# Patient Record
Sex: Male | Born: 1990 | Race: White | Hispanic: No | Marital: Single | Smoking: Never smoker
Health system: Southern US, Community
[De-identification: ages and names within clinical notes are randomized; demographics above are authoritative.]

## PROBLEM LIST (undated history)

## (undated) HISTORY — PX: TONSILLECTOMY: SUR1361

## (undated) HISTORY — PX: WISDOM TOOTH EXTRACTION: SHX21

---

## 2019-12-20 ENCOUNTER — Encounter (HOSPITAL_BASED_OUTPATIENT_CLINIC_OR_DEPARTMENT_OTHER): Payer: Self-pay

## 2019-12-20 ENCOUNTER — Emergency Department (HOSPITAL_BASED_OUTPATIENT_CLINIC_OR_DEPARTMENT_OTHER): Payer: Self-pay

## 2019-12-20 ENCOUNTER — Emergency Department (HOSPITAL_BASED_OUTPATIENT_CLINIC_OR_DEPARTMENT_OTHER): Payer: No Typology Code available for payment source

## 2019-12-20 ENCOUNTER — Emergency Department (HOSPITAL_BASED_OUTPATIENT_CLINIC_OR_DEPARTMENT_OTHER)
Admission: EM | Admit: 2019-12-20 | Discharge: 2019-12-20 | Disposition: A | Discharge status: Home / Self Care | Payer: No Typology Code available for payment source | Attending: Emergency Medicine | Admitting: Emergency Medicine

## 2019-12-20 ENCOUNTER — Other Ambulatory Visit: Payer: Self-pay

## 2019-12-20 DIAGNOSIS — Y990 Civilian activity done for income or pay: Secondary | ICD-10-CM | POA: Diagnosis not present

## 2019-12-20 DIAGNOSIS — W208XXA Other cause of strike by thrown, projected or falling object, initial encounter: Secondary | ICD-10-CM | POA: Insufficient documentation

## 2019-12-20 DIAGNOSIS — S3994XA Unspecified injury of external genitals, initial encounter: Secondary | ICD-10-CM | POA: Insufficient documentation

## 2019-12-20 MED ORDER — LIDOCAINE HCL URETHRAL/MUCOSAL 2 % EX GEL
1.0000 "application " | Freq: Once | CUTANEOUS | Status: AC
  Administered 2019-12-20: 1 via TOPICAL
  Filled 2019-12-20: qty 11

## 2019-12-20 MED ORDER — IBUPROFEN 800 MG PO TABS
800.0000 mg | ORAL_TABLET | Freq: Once | ORAL | Status: AC
  Administered 2019-12-20: 800 mg via ORAL
  Filled 2019-12-20: qty 1

## 2019-12-20 NOTE — Discharge Instructions (Signed)
Your work-up today was reassuring, continue to take ibuprofen for the pain. I want you to follow-up with your employee health. If you have any new worsening concerning symptoms please come back to the emergency department.

## 2019-12-20 NOTE — ED Triage Notes (Addendum)
Pt states "something flew out of a machine at work and hit me in the right testicle" ~1225pm-was seen by work nurse-sent to "HPO"/occupational health site and sent to ED-NAD-steady gait

## 2019-12-20 NOTE — ED Provider Notes (Signed)
, Use [facility] EMERGENCY DEPARTMENT Provider Note   CSN: [id number] Arrival date & time: 12/20/19  1435     History Chief Complaint  Patient presents with  . Testicle Injury    Jose Robbins is a 29 y.o. male with no pertinent past medical history that presents the emergency department today for testicle injury.  Patient states that he was at work doing a 20 ton press and something flew out of the machine and hit him in the right testicle.  Patient states that it did not pierce his pants or his boxer briefs.  Patient states that he is having throbbing pain to his right testicle, is not swollen.  Was evaluated by occupational health who told him to come to the emergency department.  Patient has no prior history of injury to that area.  Denies pain elsewhere.  States that his last tetanus shot was last month.  States that the pain is a 7/10 and is constant.  Also slightly radiates into his right leg.  No injury to the right leg.  Does admit to some numbness into his right leg, no numbness or tingling elsewhere.  Denies any dysuria or hematuria or penile pain. States that he was in normal health before this.  Denies any fevers chills nausea, vomiting, abdominal pain, back pain.  HPI     History reviewed. No pertinent past medical history.  There are no problems to display for this patient.   Past Surgical History:  Procedure Laterality Date  . TONSILLECTOMY    . WISDOM TOOTH EXTRACTION         No family history on file.  Social History   Tobacco Use  . Smoking status: Never Smoker  . Smokeless tobacco: Current User  Vaping Use  . Vaping Use: Never used  Substance Use Topics  . Alcohol use: Never  . Drug use: Never    Home Medications Prior to Admission medications   Not on File    Allergies    Codeine  Review of Systems   Review of Systems  Constitutional: Negative for chills, diaphoresis, fatigue and fever.  HENT: Negative for congestion, sore  throat and trouble swallowing.   Eyes: Negative for pain and visual disturbance.  Respiratory: Negative for cough, shortness of breath and wheezing.   Cardiovascular: Negative for chest pain, palpitations and leg swelling.  Gastrointestinal: Negative for abdominal distention, abdominal pain, diarrhea, nausea and vomiting.  Genitourinary: Positive for testicular pain. Negative for decreased urine volume, difficulty urinating, discharge, flank pain, frequency, genital sores, hematuria, penile pain, penile swelling, scrotal swelling and urgency.  Musculoskeletal: Negative for back pain, neck pain and neck stiffness.  Skin: Negative for pallor.  Neurological: Negative for dizziness, speech difficulty, weakness and headaches.  Psychiatric/Behavioral: Negative for confusion.    Physical Exam Updated Vital Signs BP 118/74 (BP Location: Left Arm)   Pulse 85   Temp 98.6 F (37 C) (Oral)   Resp 16   Ht 6\' 3"  (1.905 m)   Wt 92.1 kg   SpO2 97%   BMI 25.37 kg/m   Physical Exam Exam conducted with a chaperone present.  Constitutional:      General: He is not in acute distress.    Appearance: Normal appearance. He is not ill-appearing, toxic-appearing or diaphoretic.  HENT:     Mouth/Throat:     Mouth: Mucous membranes are moist.     Pharynx: Oropharynx is clear.  Eyes:     General: No scleral icterus.  Extraocular Movements: Extraocular movements intact.     Pupils: Pupils are equal, round, and reactive to light.  Cardiovascular:     Rate and Rhythm: Normal rate and regular rhythm.     Pulses: Normal pulses.     Heart sounds: Normal heart sounds.  Pulmonary:     Effort: Pulmonary effort is normal. No respiratory distress.     Breath sounds: Normal breath sounds. No stridor. No wheezing, rhonchi or rales.  Chest:     Chest wall: No tenderness.  Abdominal:     General: Abdomen is flat. There is no distension.     Palpations: Abdomen is soft.     Tenderness: There is no abdominal  tenderness. There is no guarding or rebound.  Genitourinary:    Penis: Normal.      Testes:        Right: Tenderness present.     Epididymis:     Right: Normal.       Comments: Chaperone present very small, half a centimeter in width abrasion to right testicle, with some erythema, does appear burn-like. No bulla, drainage, or bleeding. No edema or erythema surrounding the area. No scrotal swelling or tenderness. Penis normal. Normal cremaster reflex. Musculoskeletal:        General: No swelling or tenderness. Normal range of motion.     Cervical back: Normal range of motion and neck supple. No rigidity.     Right lower leg: No edema.     Left lower leg: No edema.  Skin:    General: Skin is warm and dry.     Capillary Refill: Capillary refill takes less than 2 seconds.     Coloration: Skin is not pale.  Neurological:     General: No focal deficit present.     Mental Status: He is alert and oriented to person, place, and time.  Psychiatric:        Mood and Affect: Mood normal.        Behavior: Behavior normal.     ED Results / Procedures / Treatments   Labs (all labs ordered are listed, but only abnormal results are displayed) Labs Reviewed - No data to display  EKG None  Radiology US Scrotum  Result Date: 12/20/2019 CLINICAL DATA:  Hit in right testicle with object EXAM: SCROTAL ULTRASOUND DOPPLER ULTRASOUND OF THE TESTICLES TECHNIQUE: Complete ultrasound examination of the testicles, epididymis, and other scrotal structures was performed. Color and spectral Doppler ultrasound were also utilized to evaluate blood flow to the testicles. COMPARISON:  None. FINDINGS: Right testicle Measurements: 4.1 x 2.7 x 3 cm. No mass or microlithiasis visualized. Left testicle Measurements: 4.4 x 2.5 x 3.5 cm. No mass or microlithiasis visualized. Right epididymis:  Normal in size and appearance. Left epididymis:  Normal in size and appearance. Hydrocele:  None visualized. Varicocele:  None  visualized. Pulsed Doppler interrogation of both testes demonstrates normal low resistance arterial and venous waveforms bilaterally. IMPRESSION: Negative scrotal ultrasound Electronically Signed   By: Jasmine Pang M.D.   On: 12/20/2019 16:40    Procedures Procedures (including critical care time)  Medications Ordered in ED Medications  ibuprofen (ADVIL) tablet 800 mg (800 mg Oral Given 12/20/19 1535)  lidocaine (XYLOCAINE) 2 % jelly 1 application (1 application Topical Given 12/20/19 1536)    ED Course  I have reviewed the triage vital signs and the nursing notes.  Pertinent labs & imaging results that were available during my care of the patient were reviewed by me and considered  in my medical decision making (see chart for details).    MDM Rules/Calculators/A&P                          Jose Robbins is a 29 y.o. male with no pertinent past medical history that presents the emergency department today for testicle injury. Patient with small abrasion to right testicle, does appear as if patient was burned. Most likley friction burn. No pierce injury. Lidoderm jelly placed regards to pain, will also obtain ultrasound due to blunt trauma of testicle. Patient appears very well, no concerns for urethral injury, or other injury.  States that he did urinate, did not notice any hematuria or painful urination.  Ultrasound negative, pain has been controlled. Patient be discharged to follow-up with his employee health, patient agreeable. Patient be discharged at this time.States that ibuprofen did help the pain on reassessment.  Patient has good follow-up with employee health, symptomatic treatment discussed.  Doubt need for further emergent work up at this time. I explained the diagnosis and have given explicit precautions to return to the ER including for any other new or worsening symptoms. The patient understands and accepts the medical plan as it's been dictated and I have answered their  questions. Discharge instructions concerning home care and prescriptions have been given. The patient is STABLE and is discharged to home in good condition.  I discussed this case with my attending physician who cosigned this note including patient's presenting symptoms, physical exam, and planned diagnostics and interventions. Attending physician stated agreement with plan or made changes to plan which were implemented.   Attending physician assessed patient at bedside.  Final Clinical Impression(s) / ED Diagnoses Final diagnoses:  Testicular injury, initial encounter    Rx / DC Orders ED Discharge Orders    None       Farrel Gordon, PA-C 12/20/19 1703    Tilden Fossa, MD 12/20/19 2336

## 2019-12-20 NOTE — ED Notes (Signed)
Per EDP, we can send the rest of the Lidocaine topical gel with the patient for him to apply to his right testicle.

## 2021-07-05 IMAGING — US US SCROTUM
1 series · 14 of 25 positions shown · non-contrast
Comparison: None.

CLINICAL DATA: Hit in right testicle with object

EXAM:
SCROTAL ULTRASOUND
DOPPLER ULTRASOUND OF THE TESTICLES
TECHNIQUE: Complete ultrasound examination of the testicles, epididymis, and
other scrotal structures was performed. Color and spectral Doppler
ultrasound were also utilized to evaluate blood flow to the
testicles.

[Series 1: us scrotum · 14 of 27 slices shown]
[im 1/27]
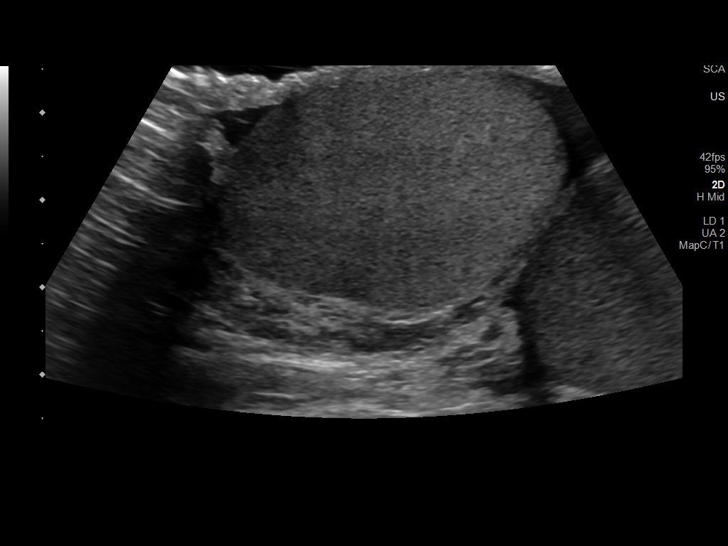
[im 3/27]
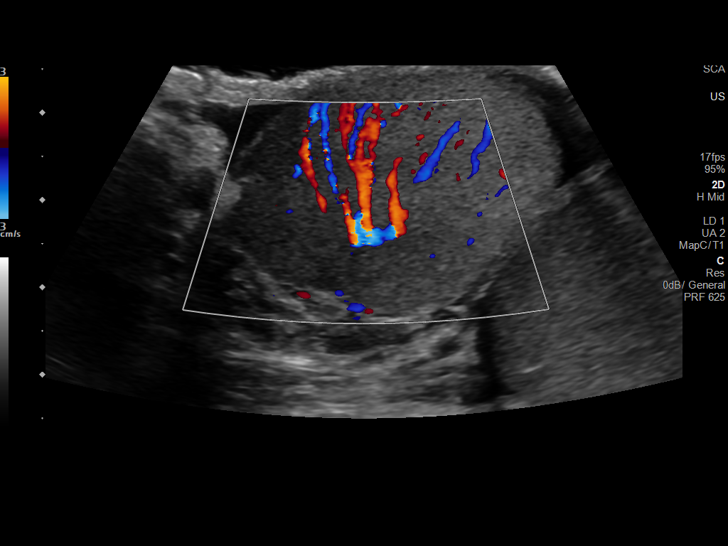
[im 5/27]
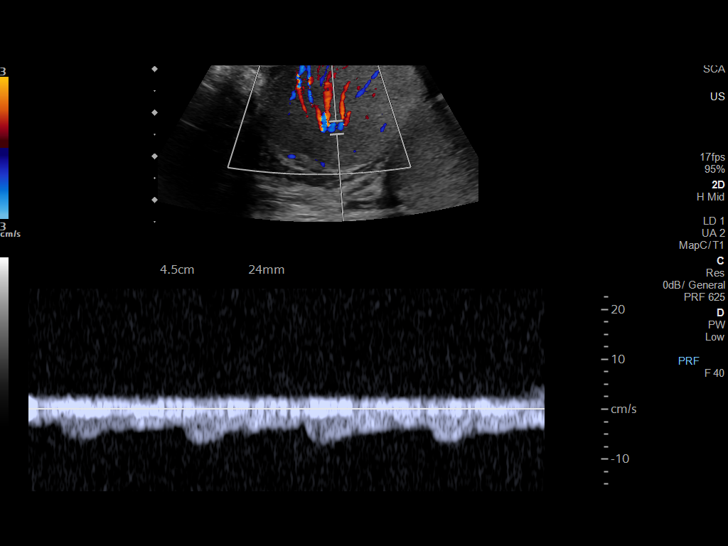
[im 7/27]
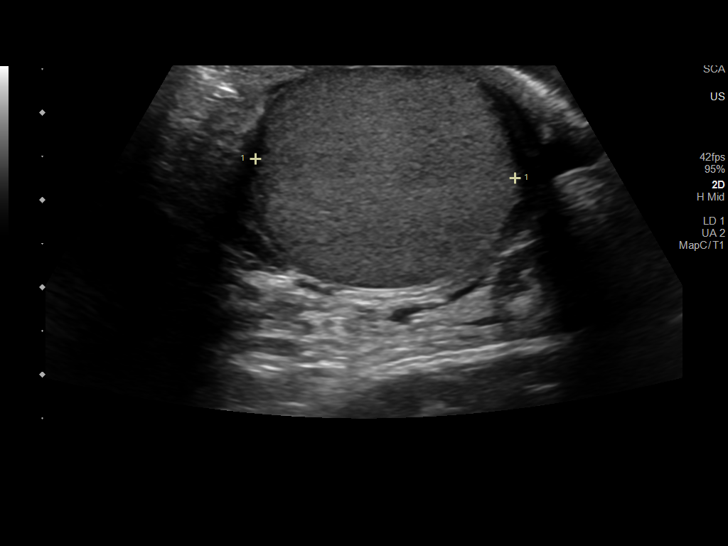
[im 9/27]
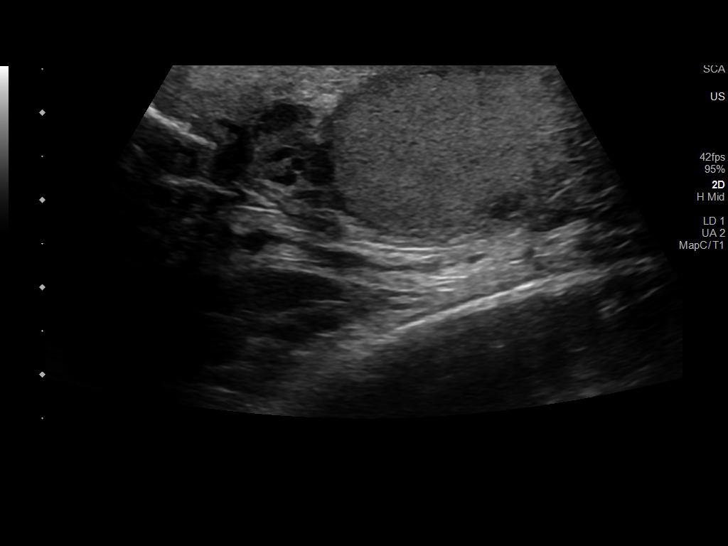
[im 10/27]
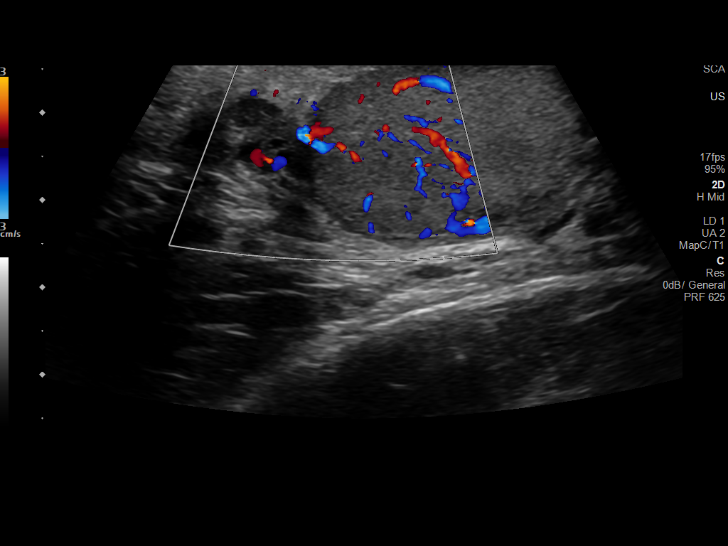
[im 12/27]
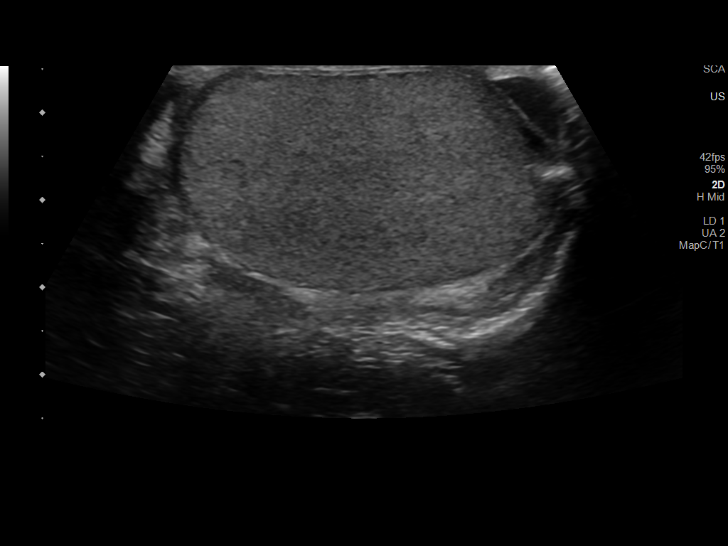
[im 15/27]
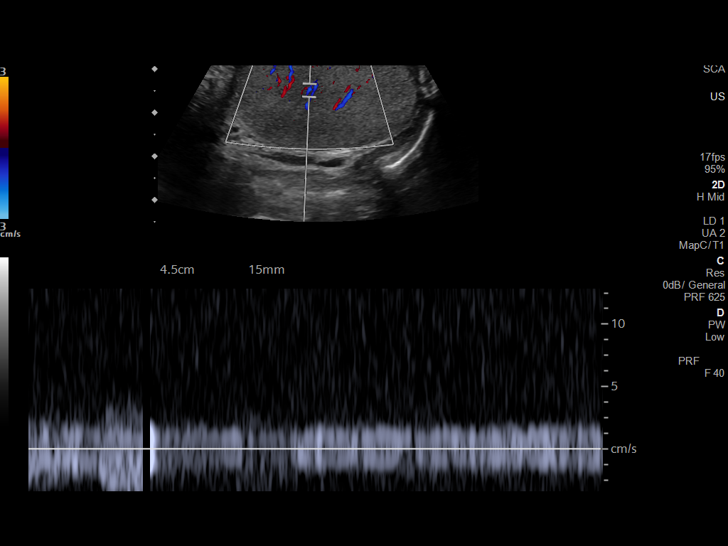
[im 17/27]
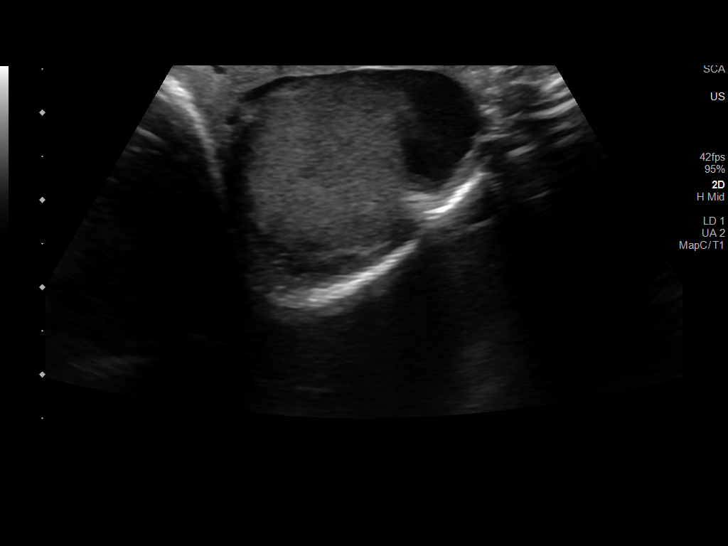
[im 18/27]
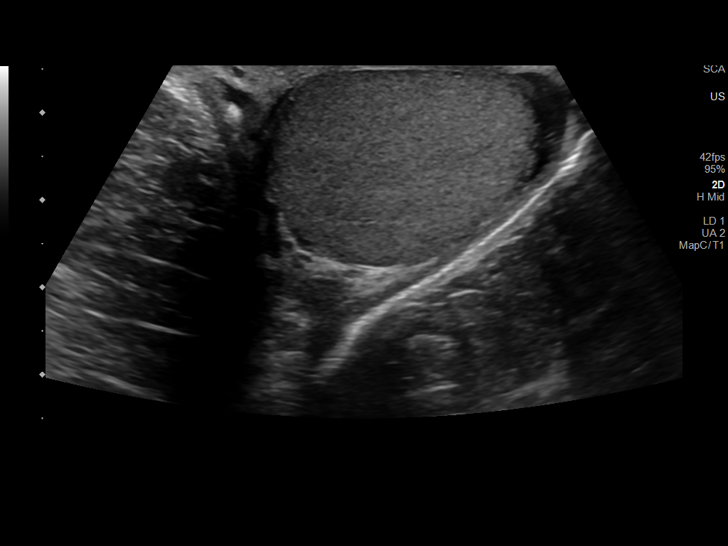
[im 20/27]
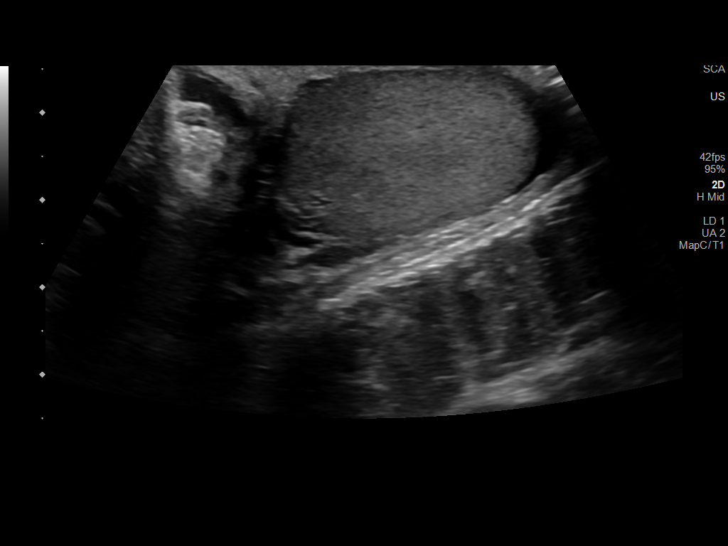
[im 22/27]
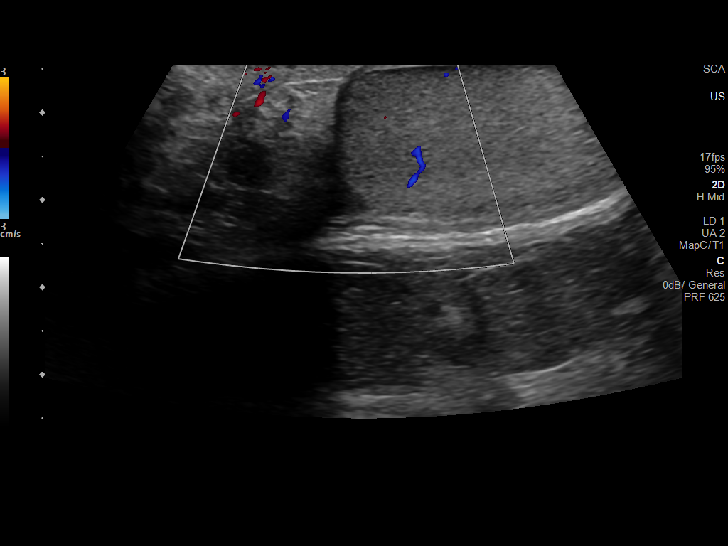
[im 24/27]
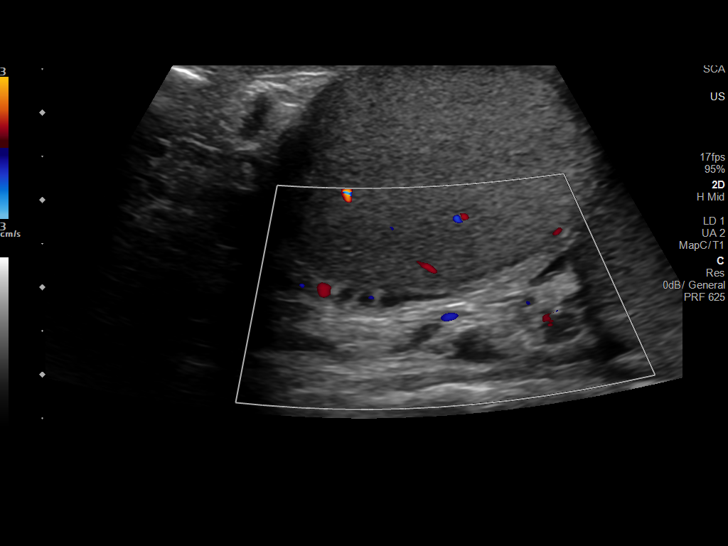
[im 27/27]
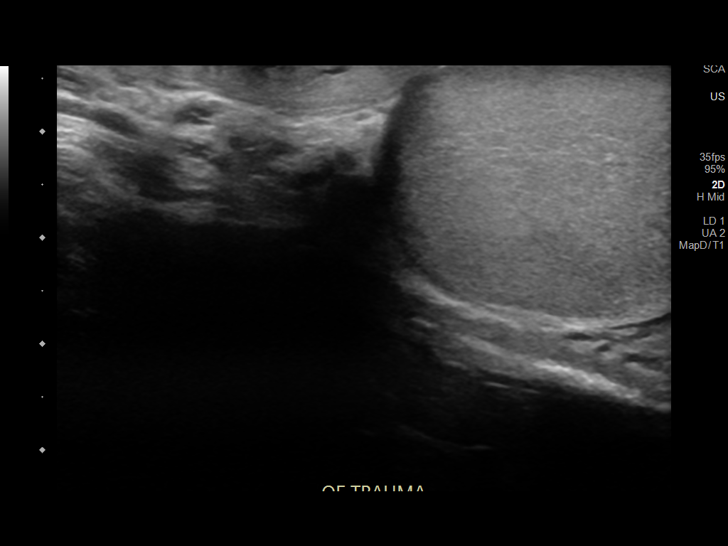

[14 of 25 positions shown; findings below may reference images not displayed]

FINDINGS: Right testicle

Measurements: 4.1 x 2.7 x 3 cm. No mass or microlithiasis
visualized.

Left testicle

Measurements: 4.4 x 2.5 x 3.5 cm. No mass or microlithiasis
visualized.

Right epididymis:  Normal in size and appearance.

Left epididymis:  Normal in size and appearance.

Hydrocele:  None visualized.

Varicocele:  None visualized.

Pulsed Doppler interrogation of both testes demonstrates normal low
resistance arterial and venous waveforms bilaterally.
IMPRESSION: Negative scrotal ultrasound
